# Patient Record
Sex: Female | Born: 1935 | Race: Black or African American | Hispanic: No | Marital: Married | Smoking: Former smoker
Health system: Southern US, Community
[De-identification: ages and names within clinical notes are randomized; demographics above are authoritative.]

## PROBLEM LIST (undated history)

## (undated) DIAGNOSIS — C169 Malignant neoplasm of stomach, unspecified: Secondary | ICD-10-CM

## (undated) DIAGNOSIS — H269 Unspecified cataract: Secondary | ICD-10-CM

## (undated) DIAGNOSIS — E079 Disorder of thyroid, unspecified: Secondary | ICD-10-CM

## (undated) DIAGNOSIS — N189 Chronic kidney disease, unspecified: Secondary | ICD-10-CM

## (undated) DIAGNOSIS — D631 Anemia in chronic kidney disease: Secondary | ICD-10-CM

## (undated) HISTORY — DX: Disorder of thyroid, unspecified: E07.9

## (undated) HISTORY — DX: Malignant neoplasm of stomach, unspecified: C16.9

## (undated) HISTORY — DX: Chronic kidney disease, unspecified: N18.9

## (undated) HISTORY — DX: Unspecified cataract: H26.9

## (undated) HISTORY — DX: Anemia in chronic kidney disease: D63.1

---

## 2012-04-07 HISTORY — PX: CATARACT EXTRACTION: SUR2

## 2015-10-02 ENCOUNTER — Telehealth: Payer: Self-pay | Admitting: *Deleted

## 2015-10-02 ENCOUNTER — Encounter: Payer: Self-pay | Admitting: Internal Medicine

## 2015-10-02 ENCOUNTER — Inpatient Hospital Stay: Payer: Medicare HMO

## 2015-10-02 ENCOUNTER — Inpatient Hospital Stay: Payer: Medicare HMO | Attending: Internal Medicine | Admitting: Internal Medicine

## 2015-10-02 VITALS — BP 105/62 | HR 94 | Temp 98.0°F | Resp 18 | Wt 144.6 lb

## 2015-10-02 DIAGNOSIS — C787 Secondary malignant neoplasm of liver and intrahepatic bile duct: Secondary | ICD-10-CM | POA: Diagnosis not present

## 2015-10-02 DIAGNOSIS — C49A2 Gastrointestinal stromal tumor of stomach: Secondary | ICD-10-CM | POA: Diagnosis not present

## 2015-10-02 DIAGNOSIS — Z87891 Personal history of nicotine dependence: Secondary | ICD-10-CM | POA: Diagnosis not present

## 2015-10-02 DIAGNOSIS — Z79899 Other long term (current) drug therapy: Secondary | ICD-10-CM | POA: Insufficient documentation

## 2015-10-02 DIAGNOSIS — D649 Anemia, unspecified: Secondary | ICD-10-CM | POA: Diagnosis not present

## 2015-10-02 DIAGNOSIS — C772 Secondary and unspecified malignant neoplasm of intra-abdominal lymph nodes: Secondary | ICD-10-CM | POA: Diagnosis not present

## 2015-10-02 DIAGNOSIS — D6489 Other specified anemias: Secondary | ICD-10-CM

## 2015-10-02 DIAGNOSIS — R221 Localized swelling, mass and lump, neck: Secondary | ICD-10-CM | POA: Insufficient documentation

## 2015-10-02 LAB — CBC WITH DIFFERENTIAL/PLATELET
Basophils Absolute: 0.1 10*3/uL (ref 0–0.1)
EOS ABS: 0.3 10*3/uL (ref 0–0.7)
Eosinophils Relative: 3 %
HCT: 19.9 % — ABNORMAL LOW (ref 35.0–47.0)
HEMOGLOBIN: 6.4 g/dL — AB (ref 12.0–16.0)
LYMPHS ABS: 0.9 10*3/uL — AB (ref 1.0–3.6)
Lymphocytes Relative: 9 %
MCH: 33.7 pg (ref 26.0–34.0)
MCHC: 32.1 g/dL (ref 32.0–36.0)
MCV: 104.8 fL — ABNORMAL HIGH (ref 80.0–100.0)
MONO ABS: 1.1 10*3/uL — AB (ref 0.2–0.9)
Neutro Abs: 7.4 10*3/uL — ABNORMAL HIGH (ref 1.4–6.5)
Platelets: 497 10*3/uL — ABNORMAL HIGH (ref 150–440)
RBC: 1.9 MIL/uL — ABNORMAL LOW (ref 3.80–5.20)
RDW: 20.6 % — AB (ref 11.5–14.5)
WBC: 9.7 10*3/uL (ref 3.6–11.0)

## 2015-10-02 LAB — COMPREHENSIVE METABOLIC PANEL
ALBUMIN: 3.2 g/dL — AB (ref 3.5–5.0)
ALT: 12 U/L — AB (ref 14–54)
AST: 39 U/L (ref 15–41)
Alkaline Phosphatase: 76 U/L (ref 38–126)
Anion gap: 9 (ref 5–15)
BUN: 20 mg/dL (ref 6–20)
CHLORIDE: 102 mmol/L (ref 101–111)
CO2: 23 mmol/L (ref 22–32)
CREATININE: 1.97 mg/dL — AB (ref 0.44–1.00)
Calcium: 9 mg/dL (ref 8.9–10.3)
GFR calc Af Amer: 26 mL/min — ABNORMAL LOW (ref >60)
GFR, EST NON AFRICAN AMERICAN: 22 mL/min — AB (ref >60)
GLUCOSE: 110 mg/dL — AB (ref 65–99)
Potassium: 4.3 mmol/L (ref 3.5–5.1)
SODIUM: 134 mmol/L — AB (ref 135–145)
Total Bilirubin: 0.5 mg/dL (ref 0.3–1.2)
Total Protein: 6.4 g/dL — ABNORMAL LOW (ref 6.5–8.1)

## 2015-10-02 LAB — FERRITIN: FERRITIN: 515 ng/mL — AB (ref 11–307)

## 2015-10-02 LAB — LACTATE DEHYDROGENASE: LDH: 618 U/L — AB (ref 98–192)

## 2015-10-02 LAB — IRON AND TIBC
Iron: 47 ug/dL (ref 28–170)
SATURATION RATIOS: 19 % (ref 10.4–31.8)
TIBC: 247 ug/dL — ABNORMAL LOW (ref 250–450)
UIBC: 200 ug/dL

## 2015-10-02 MED ORDER — IMATINIB MESYLATE 100 MG PO TABS: 400.0000 mg | ORAL_TABLET | Freq: Every day | ORAL | Status: AC

## 2015-10-02 MED ORDER — ONDANSETRON HCL 8 MG PO TABS: 8.0000 mg | ORAL_TABLET | Freq: Three times a day (TID) | ORAL | Status: AC | PRN

## 2015-10-02 NOTE — Progress Notes (Signed)
[facility] CONSULT NOTE  No care team member to display  CHIEF COMPLAINTS/PURPOSE OF CONSULTATION:  Oncology History   # SEP 2013-s/p Bx- GIST Upper GIB /[facility] [annular mass in stomach]; eval at [facility] [Dr.Hathorn]   # OCT 2013- Gleevec 400mg /d; 200mg / [sec to intol] March-Oct 2014- CT- PR;   # MAY 11th 2017- CT [non-contras tC/A/P compared to jan 2017]- PROGRESSION multiple innumerable lesions in liver [35mm- 3.3cm]; LUQ mass [10x 9.3--7.5 x 6.0]; new portahepatis LN; peri-tumoral LN; LN in mesenteric fat ~2.5cm   # May 11th 2017- Thyroid enlargement [R>L]  # Anemia/ CKD      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  female patient with above history of metastatic gist tumor from [place] is currently here to establish care. I reviewed the records from her previous oncologist office Dr. Humphrey Rolls in [place] above.  Patient complains of progressive shortness of breath in the last few days. No cough. Poor appetite. No significant weight loss. No nausea no vomiting. She is on oral iron. Complains of dark stool. No blood in stools. She has rare left upper quadrant pain. She has no back pain. No other bone pain.   ROS: A complete 10 point review of system is done which is negative except mentioned above in history of present illness  MEDICAL HISTORY:  Past Medical History  Diagnosis Date  . Stomach cancer ([place])   . Cataract   . Thyroid disease     SURGICAL HISTORY: Past Surgical History  Procedure Laterality Date  . Cataract extraction  2014    SOCIAL HISTORY:No current smoking. No alcohol. Patient lives with her daughter in [place]. Social History   Social History  . Marital Status: Unknown    Spouse Name: N/A  . Number of Children: N/A  . Years of Education: N/A    Occupational History  . Not on file.   Social History Main Topics  . Smoking status: Former Smoker -- 1.00 packs/day for 40 years    Types: Cigarettes  . Smokeless tobacco: Former Systems developer    Quit date: 09/05/2005  . Alcohol Use: Not on file  . Drug Use: Not on file  . Sexual Activity: Not on file   Other Topics Concern  . Not on file   Social History Narrative  . No narrative on file    FAMILY HISTORY: No family history on file.  ALLERGIES:  is allergic to penicillins.  MEDICATIONS:  Current Outpatient Prescriptions  Medication Sig Dispense Refill  . ACCU-CHEK FASTCLIX LANCETS MISC     . acetaminophen (TYLENOL) 325 MG tablet Take 650 mg by mouth.    . citalopram (CELEXA) 20 MG tablet Take 20 mg by mouth daily.     . ferrous sulfate 325 (65 FE) MG EC tablet Take 325 mg by mouth 3 (three) times daily with meals.    . imatinib (GLEEVEC) 100 MG tablet Take 4 tablets (400 mg total) by mouth daily. 120 tablet 4  . lisinopril (PRINIVIL,ZESTRIL) 2.5 MG tablet Take 2.5 mg by mouth daily.     Marland Kitchen losartan (COZAAR) 25 MG tablet Take 25 mg by mouth daily.     . ondansetron (ZOFRAN) 8 MG tablet Take 1 tablet (8 mg total) by mouth every 8 (eight) hours as needed for nausea. 60 tablet 3  . pioglitazone (ACTOS) 30  MG tablet Take 30 mg by mouth.    . ranitidine (ZANTAC) 150 MG tablet Take 150 mg by mouth.     No current facility-administered medications for this visit.      Marland Kitchen  PHYSICAL EXAMINATION: ECOG PERFORMANCE STATUS: 1 - Symptomatic but completely ambulatory  Filed Vitals:   10/02/15 1421  BP: 105/62  Pulse: 94  Temp: 98 F (36.7 C)  Resp: 18   Filed Weights   10/02/15 1421  Weight: 144 lb 10 oz (65.6 kg)    GENERAL: Well-nourished well-developed; Alert, Mild shortness of breath on exertion. Accompanied by 3 daughters. EYES: positive for pallor.  OROPHARYNX: no thrush or ulceration.  NECK: supple, bilateral thyroid masses felt. LYMPH:  no palpable lymphadenopathy  in the cervical, axillary or inguinal regions LUNGS: clear to auscultation and  No wheeze or crackles HEART/CVS: regular rate & rhythm and no murmurs; No lower extremity edema ABDOMEN: abdomen soft, non-tender and normal bowel sounds Musculoskeletal:no cyanosis of digits and no clubbing  PSYCH: alert & oriented x 3 with fluent speech NEURO: no focal motor/sensory deficits SKIN:  no rashes or significant lesions  LABORATORY DATA:  I have reviewed the data as listed Lab Results  Component Value Date   WBC 9.7 10/02/2015   HGB 6.4* 10/02/2015   HCT 19.9* 10/02/2015   MCV 104.8* 10/02/2015   PLT 497* 10/02/2015    Recent Labs  10/02/15 1536  NA 134*  K 4.3  CL 102  CO2 23  GLUCOSE 110*  BUN 20  CREATININE 1.97*  CALCIUM 9.0  GFRNONAA 22*  GFRAA 26*  PROT 6.4*  ALBUMIN 3.2*  AST 39  ALT 12*  ALKPHOS 76  BILITOT 0.5    RADIOGRAPHIC STUDIES: I have personally reviewed the radiological images as listed and agreed with the findings in the report. No results found.  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 I recommend going up on the [place] to 400 mg once a day. Discussed regarding use of antiemetics preemptively.  # I discussed the use of sunitinib; and also use of Regorafenib in the treatment of progressive gist tumor. Patient has been on suboptimal doses of Gleevec so foar; certainly could be reasonable to go up on the dose and await the response.  # Given the history of anemia- check CBC CMP LDH iron studies. Patient mentions of a subcutaneous injection question Procrit. We will await labs.  # Neck swelling/thyroid enlargement- does not appear to cause any upper respiratory tract obstruction however as per family the neck enlargement has been recent- we will get a CT of the neck without contrast.   Based  on the CT scan we will plan to get a- ultrasound of the neck.   All questions were answered. The patient knows to call the clinic with any problems, questions or concerns.  # 60 minutes face-to-face with the patient discussing the above plan of care; more than 50% of time spent on prognosis/ natural history; counseling and coordination.    Cammie Sickle, MD 10/02/2015 5:03 PM  Addendum:   Patient labs show hemoglobin of 6.4 MCV 104; hemoglobin 6 and platelets 400. Creatinine 1.97. Patient had already left the building/driving to [place]; recommend go to emergency room for blood transfusion.

## 2015-10-02 NOTE — Assessment & Plan Note (Signed)
Metastatic gist to the liver and abdominal lymph nodes- currently patient is on 300 mg of Gleevec for the last 1 month. I recommend going up on the [place] to 400 mg once a day. Discussed regarding use of antiemetics preemptively.  # I discussed the use of sunitinib; and also use of Regorafenib in the treatment of progressive gist tumor. Patient has been on suboptimal doses of Gleevec so foar; certainly could be reasonable to go up on the dose and await the response.  # Given the history of anemia- check CBC CMP LDH iron studies. Patient mentions of a subcutaneous injection question Procrit. We will await labs.  # Neck swelling/thyroid enlargement- does not appear to cause any upper respiratory tract obstruction however as per family the neck enlargement has been recent- we will get a CT of the neck without contrast.

## 2015-10-02 NOTE — Telephone Encounter (Signed)
Critical Hgb - 6.4 Hct 19.9  MD notified.  VO per MD - call patient's daughter and have her contact PCP and see if blood transfusion can be done in [place]?  If not she should call us back and we will schedule patient for transfusion.  Called patient's daughter and she states she will contact PCP and if they cannot cannot help her she will take patient to ED.

## 2015-10-02 NOTE — Progress Notes (Signed)
Patient here today as new evaluation from [facility] closed down.  Referred by Dr. Marcy Panning  Patient has stomach cancer with mets to liver and lymph system.  Currently on Gleevac.  Patient states she has an enlarged area in her neck that she was to have biopsied, however, that has not been scheduled.  Patient extremely SOB on exertion.  O2 99%, however, had to sit after walking from waiting room. Patient requested information regarding advance directive. Information given to patient.

## 2015-10-02 NOTE — Progress Notes (Signed)
New patient transfer of care. Patient currently on Gleevac. She uses [facility].  She experiences intermittent nausea with her gleevac which is control with her antiemetic-zofran.  She has not missed any doses. Although she reports a nodule in her neck, she does not have any concerns with dysphagia. She is able to swallow her medications/tablets easily.  A consent form was signed today, while patient was in the clinic to continue her gleevac rx under the care of Dr. Rogue Bussing. Patient states that she received financial assistance with Gleevac. She states that she does not have any copay cost. She does not know which company assists her with the copay. I explained that I will contact [employer] for this information. She believes that her copay assistance should be available until 2018.

## 2015-10-10 ENCOUNTER — Telehealth: Payer: Self-pay | Admitting: *Deleted

## 2015-10-10 ENCOUNTER — Ambulatory Visit
Admission: RE | Admit: 2015-10-10 | Discharge: 2015-10-10 | Disposition: A | Discharge status: Home / Self Care | Payer: Medicare HMO | Source: Ambulatory Visit | Attending: Internal Medicine | Admitting: Internal Medicine

## 2015-10-10 DIAGNOSIS — N289 Disorder of kidney and ureter, unspecified: Secondary | ICD-10-CM | POA: Diagnosis not present

## 2015-10-10 DIAGNOSIS — C49A2 Gastrointestinal stromal tumor of stomach: Secondary | ICD-10-CM | POA: Insufficient documentation

## 2015-10-10 DIAGNOSIS — R221 Localized swelling, mass and lump, neck: Secondary | ICD-10-CM | POA: Diagnosis present

## 2015-10-10 DIAGNOSIS — R590 Localized enlarged lymph nodes: Secondary | ICD-10-CM | POA: Diagnosis not present

## 2015-10-10 DIAGNOSIS — M47812 Spondylosis without myelopathy or radiculopathy, cervical region: Secondary | ICD-10-CM | POA: Diagnosis not present

## 2015-10-10 DIAGNOSIS — D6489 Other specified anemias: Secondary | ICD-10-CM | POA: Diagnosis present

## 2015-10-10 DIAGNOSIS — J439 Emphysema, unspecified: Secondary | ICD-10-CM | POA: Insufficient documentation

## 2015-10-10 NOTE — Telephone Encounter (Signed)
Pt having persistent nausea with 4 tablets of gleevac 100 mg. Daughter reports that the patient is taking all 4 tabs at once. RN Spoke with md. Dr. Rogue Bussing advised pt to start dividing the dose. She needs to take gleevac 100 mg 2 tabs in am and 2 tabs in pm.   Teach back process performed.

## 2015-10-12 ENCOUNTER — Telehealth: Payer: Self-pay | Admitting: *Deleted

## 2015-10-12 ENCOUNTER — Other Ambulatory Visit: Payer: Self-pay | Admitting: Internal Medicine

## 2015-10-12 DIAGNOSIS — N189 Chronic kidney disease, unspecified: Secondary | ICD-10-CM

## 2015-10-12 DIAGNOSIS — D638 Anemia in other chronic diseases classified elsewhere: Secondary | ICD-10-CM

## 2015-10-12 DIAGNOSIS — D631 Anemia in chronic kidney disease: Secondary | ICD-10-CM | POA: Insufficient documentation

## 2015-10-12 NOTE — Telephone Encounter (Signed)
Daughter called this morning. Patient continues to have persistent nausea and vomiting. She is taking zofran every 8 hrs as directed.  She is taking Gleevac 100 mg 2 tablets in am and 2 tabs in pm.  Since increasing dosing change to 400 mgs daily, daughter feels that the dose may be 'too strong.' daughter requesting dose reduction to 300 mg. (daughter was instructed to have patient take Gleevac 100 mg three times daily. Daughter instructed to have patient take the gleevac in am, lunch time and bedtime.  She was instructed to contact Dr. Rogue Bussing back if nausea persists.

## 2015-10-15 ENCOUNTER — Encounter: Payer: Self-pay | Admitting: *Deleted

## 2015-10-15 ENCOUNTER — Telehealth: Payer: Self-pay | Admitting: *Deleted

## 2015-10-15 NOTE — Telephone Encounter (Signed)
-----   Message from Cammie Sickle, MD sent at 10/12/2015  7:43 PM EDT ----- i added arnesp to pt plan; need to start on 7/11- Thx

## 2015-10-15 NOTE — Telephone Encounter (Signed)
msg sent to cancer center scheduling to sch. Pt for aranesp on 7/11

## 2015-10-16 ENCOUNTER — Inpatient Hospital Stay: Payer: Medicare HMO | Attending: Internal Medicine | Admitting: Internal Medicine

## 2015-10-16 ENCOUNTER — Inpatient Hospital Stay: Payer: Medicare HMO

## 2015-10-16 ENCOUNTER — Ambulatory Visit
Admission: RE | Admit: 2015-10-16 | Discharge: 2015-10-16 | Disposition: A | Discharge status: Home / Self Care | Payer: Medicare HMO | Source: Ambulatory Visit | Attending: Internal Medicine | Admitting: Internal Medicine

## 2015-10-16 ENCOUNTER — Other Ambulatory Visit: Payer: Self-pay | Admitting: *Deleted

## 2015-10-16 VITALS — BP 120/73 | HR 102 | Temp 97.4°F | Resp 18 | Wt 141.3 lb

## 2015-10-16 DIAGNOSIS — Z79899 Other long term (current) drug therapy: Secondary | ICD-10-CM | POA: Diagnosis not present

## 2015-10-16 DIAGNOSIS — C787 Secondary malignant neoplasm of liver and intrahepatic bile duct: Secondary | ICD-10-CM | POA: Diagnosis not present

## 2015-10-16 DIAGNOSIS — D630 Anemia in neoplastic disease: Secondary | ICD-10-CM | POA: Insufficient documentation

## 2015-10-16 DIAGNOSIS — Z87891 Personal history of nicotine dependence: Secondary | ICD-10-CM | POA: Insufficient documentation

## 2015-10-16 DIAGNOSIS — C772 Secondary and unspecified malignant neoplasm of intra-abdominal lymph nodes: Secondary | ICD-10-CM | POA: Insufficient documentation

## 2015-10-16 DIAGNOSIS — C49A2 Gastrointestinal stromal tumor of stomach: Secondary | ICD-10-CM | POA: Diagnosis not present

## 2015-10-16 DIAGNOSIS — R197 Diarrhea, unspecified: Secondary | ICD-10-CM

## 2015-10-16 DIAGNOSIS — R1310 Dysphagia, unspecified: Secondary | ICD-10-CM | POA: Insufficient documentation

## 2015-10-16 DIAGNOSIS — J3801 Paralysis of vocal cords and larynx, unilateral: Secondary | ICD-10-CM | POA: Diagnosis not present

## 2015-10-16 DIAGNOSIS — D631 Anemia in chronic kidney disease: Secondary | ICD-10-CM

## 2015-10-16 DIAGNOSIS — N189 Chronic kidney disease, unspecified: Secondary | ICD-10-CM | POA: Diagnosis not present

## 2015-10-16 DIAGNOSIS — E079 Disorder of thyroid, unspecified: Secondary | ICD-10-CM | POA: Diagnosis not present

## 2015-10-16 DIAGNOSIS — R221 Localized swelling, mass and lump, neck: Secondary | ICD-10-CM | POA: Diagnosis not present

## 2015-10-16 LAB — SAMPLE TO BLOOD BANK

## 2015-10-16 LAB — CBC WITH DIFFERENTIAL/PLATELET
BASOS PCT: 0 %
Basophils Absolute: 0 10*3/uL (ref 0–0.1)
EOS ABS: 0.3 10*3/uL (ref 0–0.7)
Eosinophils Relative: 2 %
HCT: 24.5 % — ABNORMAL LOW (ref 35.0–47.0)
Hemoglobin: 8 g/dL — ABNORMAL LOW (ref 12.0–16.0)
LYMPHS ABS: 1.1 10*3/uL (ref 1.0–3.6)
Lymphocytes Relative: 7 %
MCH: 32.5 pg (ref 26.0–34.0)
MCHC: 32.6 g/dL (ref 32.0–36.0)
MCV: 99.8 fL (ref 80.0–100.0)
MONO ABS: 1.5 10*3/uL — AB (ref 0.2–0.9)
Monocytes Relative: 10 %
NRBC: 0 /100{WBCs}
Neutro Abs: 12.5 10*3/uL — ABNORMAL HIGH (ref 1.4–6.5)
Neutrophils Relative %: 81 %
PLATELETS: 399 10*3/uL (ref 150–440)
RBC: 2.46 MIL/uL — ABNORMAL LOW (ref 3.80–5.20)
RDW: 20.4 % — AB (ref 11.5–14.5)
WBC: 15.4 10*3/uL — ABNORMAL HIGH (ref 3.6–11.0)

## 2015-10-16 LAB — COMPREHENSIVE METABOLIC PANEL
ALK PHOS: 97 U/L (ref 38–126)
ALT: 15 U/L (ref 14–54)
ANION GAP: 13 (ref 5–15)
AST: 45 U/L — ABNORMAL HIGH (ref 15–41)
Albumin: 3 g/dL — ABNORMAL LOW (ref 3.5–5.0)
BILIRUBIN TOTAL: 1.3 mg/dL — AB (ref 0.3–1.2)
BUN: 25 mg/dL — ABNORMAL HIGH (ref 6–20)
CALCIUM: 9 mg/dL (ref 8.9–10.3)
CO2: 20 mmol/L — AB (ref 22–32)
Chloride: 101 mmol/L (ref 101–111)
Creatinine, Ser: 1.76 mg/dL — ABNORMAL HIGH (ref 0.44–1.00)
GFR calc non Af Amer: 26 mL/min — ABNORMAL LOW (ref >60)
GFR, EST AFRICAN AMERICAN: 30 mL/min — AB (ref >60)
Glucose, Bld: 99 mg/dL (ref 65–99)
POTASSIUM: 4.2 mmol/L (ref 3.5–5.1)
SODIUM: 134 mmol/L — AB (ref 135–145)
TOTAL PROTEIN: 6.3 g/dL — AB (ref 6.5–8.1)

## 2015-10-16 MED ORDER — DARBEPOETIN ALFA 200 MCG/0.4ML IJ SOSY
200.0000 ug | PREFILLED_SYRINGE | Freq: Once | INTRAMUSCULAR | Status: AC
  Administered 2015-10-16: 200 ug via SUBCUTANEOUS
  Filled 2015-10-16: qty 0.4

## 2015-10-16 NOTE — Assessment & Plan Note (Addendum)
#   Metastatic gist to the liver and abdominal lymph nodes/ progressive on - currently patient is on 300 mg of Gleevec for the last 1 month. Recommend taking with food; recommend going up to 400 milligrams a day if her tolerance improves.   # Discussed regarding- use of sunitinib; however I'm concerned that poor tolerance to sunitinib. However continued Gleevec For now.   # Symptomatic Anemia- multifactorial/CKD/malignancy/ Gleevec- status post PRBC transfusion 2 weeks ago; today hemoglobin 8.2. start patient on Aranesp every 2 weeks. Iron stores are adequate- ferritin 500/ 2 weeks ago.  # Neck swelling/thyroid enlargement- reviewed the CT scan and no evidence of any respiratory compromise. Also recommend ultrasound and biopsy of the mass. Recommend ENT evaluation.   # Dysphagia- recommend esophagogram.   # Over poor prognosis; I discussed my above plan with the patient's primary care doctor dr.Godwin at [phone].   #  patient follow-up with me in approximately 2 weeks or sooner/ based on above workup.    # 25 minutes face-to-face with the patient discussing the above plan of care; more than 50% of time spent on prognosis/ natural history; counseling and coordination.

## 2015-10-16 NOTE — Progress Notes (Signed)
[facility] NOTE  Patient Care Team: Cammie Sickle, MD as PCP - General (Internal Medicine)  CHIEF COMPLAINTS/PURPOSE OF CONSULTATION:  Oncology History   # SEP 2013-s/p Bx- GIST Upper GIB /[facility] E[facility] [annular mass in stomach]; eval at [facility] [Dr.Hathorn]   # OCT 2013- Gleevec 400mg /d; 200mg / [sec to intol] March-Oct 2014- CT- PR;   # MAY 11th 2017- CT [non-contras tC/A/P compared to jan 2017]- PROGRESSION multiple innumerable lesions in liver [8mm- 3.3cm]; LUQ mass [10x 9.3--7.5 x 6.0]; new portahepatis LN; peri-tumoral LN; LN in mesenteric fat ~2.5cm   # May 11th 2017- Thyroid enlargement [R>L]  # Anemia/ CKD      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  female patient with above history of metastatic gist tumor from [place]- Currently on [place] is here for follow-up.  Patient received blood transfusion the emergency room and [place] 2 weeks ago when the hemoglobin was down to 6.5.   At last visit patient was recommended going up on [place] to 400 mg; patient had noticed increased nausea vomiting diarrhea. She had been taking 100 milligram pills 1 pill 3 times a day. She had been taking an empty stomach. She also has having intermittent nausea; vomiting. Intermittent diarrhea.  Patient complains of progressive shortness of breath in the last few days. No cough. Poor appetite. Patient also complains of difficulty swallowing choking while taking her pills.   ROS: A complete 10 point review of system is done which is negative except mentioned above in history of present illness  MEDICAL HISTORY:  Past Medical History  Diagnosis Date  . Stomach cancer ([place])   . Cataract   . Thyroid disease   . Anemia in CKD (chronic kidney disease)   . Anemia of chronic renal failure      SURGICAL HISTORY: Past Surgical History  Procedure Laterality Date  . Cataract extraction  2014    SOCIAL HISTORY:No current smoking. No alcohol. Patient lives with her daughter in [place]. Social History   Social History  . Marital Status: Widowed    Spouse Name: N/A  . Number of Children: N/A  . Years of Education: N/A   Occupational History  . Not on file.   Social History Main Topics  . Smoking status: Former Smoker -- 1.00 packs/day for 40 years    Types: Cigarettes  . Smokeless tobacco: Former Systems developer    Quit date: 09/05/2005  . Alcohol Use: Not on file  . Drug Use: Not on file  . Sexual Activity: Not on file   Other Topics Concern  . Not on file   Social History Narrative    FAMILY HISTORY: No family history on file.  ALLERGIES:  is allergic to penicillins.  MEDICATIONS:  Current Outpatient Prescriptions  Medication Sig Dispense Refill  . ACCU-CHEK FASTCLIX LANCETS MISC     . acetaminophen (TYLENOL) 325 MG tablet Take 650 mg by mouth.    . citalopram (CELEXA) 20 MG tablet Take 20 mg by mouth daily.     . ferrous sulfate 325 (65 FE) MG EC tablet Take 325 mg by mouth 3 (three) times daily with meals.    . imatinib (GLEEVEC) 100 MG tablet Take 4 tablets (400 mg total) by mouth daily. 120 tablet 4  . lisinopril (PRINIVIL,ZESTRIL) 2.5 MG tablet Take 2.5 mg by mouth daily.     Marland Kitchen  losartan (COZAAR) 25 MG tablet Take 25 mg by mouth daily.     . ondansetron (ZOFRAN) 8 MG tablet Take 1 tablet (8 mg total) by mouth every 8 (eight) hours as needed for nausea. 60 tablet 3  . pioglitazone (ACTOS) 30 MG tablet Take 30 mg by mouth.    . ranitidine (ZANTAC) 150 MG tablet Take 150 mg by mouth.     No current facility-administered medications for this visit.      Marland Kitchen  PHYSICAL EXAMINATION: ECOG PERFORMANCE STATUS: 2 - Symptomatic, <50% confined to bed  Filed Vitals:   10/16/15 1405  BP: 120/73  Pulse: 102  Temp: 97.4 F (36.3 C)  Resp: 18   Filed Weights    10/16/15 1405  Weight: 141 lb 5 oz (64.1 kg)    GENERAL: Well-nourished well-developed; Alert, Mild shortness of breath on exertion. Accompanied by 3 daughters. She is in a wheel chair.  EYES: positive for pallor.  OROPHARYNX: no thrush or ulceration.  NECK: supple, bilateral thyroid masses felt. LYMPH:  no palpable lymphadenopathy in the cervical, axillary or inguinal regions LUNGS: clear to auscultation and  No wheeze or crackles HEART/CVS: regular rate & rhythm and no murmurs; No lower extremity edema ABDOMEN: abdomen soft, non-tender and normal bowel sounds Musculoskeletal:no cyanosis of digits and no clubbing  PSYCH: alert & oriented x 3 with fluent speech NEURO: no focal motor/sensory deficits SKIN:  no rashes or significant lesions  LABORATORY DATA:  I have reviewed the data as listed Lab Results  Component Value Date   WBC 15.4* 10/16/2015   HGB 8.0* 10/16/2015   HCT 24.5* 10/16/2015   MCV 99.8 10/16/2015   PLT 399 10/16/2015    Recent Labs  10/02/15 1536 10/16/15 1455  NA 134* 134*  K 4.3 4.2  CL 102 101  CO2 23 20*  GLUCOSE 110* 99  BUN 20 25*  CREATININE 1.97* 1.76*  CALCIUM 9.0 9.0  GFRNONAA 22* 26*  GFRAA 26* 30*  PROT 6.4* 6.3*  ALBUMIN 3.2* 3.0*  AST 39 45*  ALT 12* 15  ALKPHOS 76 97  BILITOT 0.5 1.3*    RADIOGRAPHIC STUDIES: I have personally reviewed the radiological images as listed and agreed with the findings in the report. Ct Soft Tissue Neck Wo Contrast  10/10/2015  CLINICAL DATA:  Gastrointestinal stromal tumor stomach. Anemia. Neck mass EXAM: CT NECK WITHOUT CONTRAST TECHNIQUE: Multidetector CT imaging of the neck was performed following the standard protocol without intravenous contrast. COMPARISON:  None. FINDINGS: Intravenous contrast not given due to renal insufficiency. This limits evaluation of the thyroid mass and lymph nodes. Pharynx and larynx: Tongue and pharynx normal. No mass in the tonsillar tongue. Normal vocal cords Salivary  glands: Parotid and submandibular glands normal bilaterally. Thyroid: Large mass in the right thyroid. This appears lobular and may be several adjacent nodules. Overall the mass measures approximately 3.6 x 4.3 cm. Limited evaluation without intravenous contrast. No mass in the left lobe of the thyroid Lymph nodes: Right submandibular node 9 mm with ill-defined margins. Possible pathologic node. No other enlarged or suspicious nodes in the neck. Vascular: Vascular patency not evaluated without intravenous contrast. Mild atherosclerotic calcification in the left carotid artery. Limited intracranial: Negative Visualized orbits: Not imaged Mastoids and visualized paranasal sinuses: Negative Skeleton: Multilevel disc degeneration and spondylosis which is moderate to advanced. Multilevel facet degeneration on the right. No fracture or mass lesion. Upper chest: Apical emphysema and blebs. Small left effusion. No mass or lung nodule in the  apex apices. IMPRESSION: Suboptimal evaluation the thyroid and lymph nodes due to lack of intravenous contrast. The patient has renal insufficiency Lobular mass in the right lobe of the thyroid measuring approximately 3.6 x 4.3 cm. This is suspicious for neoplasm and biopsy is recommended. Right submandibular lymph node 9 mm is concerning for metastatic disease. Apical emphysema Cervical spondylosis Electronically Signed   By: Franchot Gallo M.D.   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 Recommend taking with food; recommend going up to 400 milligrams a day if her tolerance improves.   # Discussed regarding- use of sunitinib; however I'm concerned that poor tolerance to sunitinib. However continued Gleevec For now.   # Symptomatic Anemia- multifactorial/CKD/malignancy/  Gleevec- status post PRBC transfusion 2 weeks ago; today hemoglobin 8.2. start patient on Aranesp every 2 weeks. Iron stores are adequate- ferritin 500/ 2 weeks ago.  # Neck swelling/thyroid enlargement- reviewed the CT scan and no evidence of any respiratory compromise. Also recommend ultrasound and biopsy of the mass.  # Dysphagia- recommend esophagogram.   # Over poor prognosis; I discussed my above plan with the patient's primary care doctor dr.Godwin at [phone].   #  patient follow-up with me in approximately 2 weeks or sooner/ based on above workup.    # 25 minutes face-to-face with the patient discussing the above plan of care; more than 50% of time spent on prognosis/ natural history; counseling and coordination.   All questions were answered. The patient knows to call the clinic with any problems, questions or concerns.     Cammie Sickle, MD 10/16/2015 4:00 PM

## 2015-10-16 NOTE — Patient Instructions (Signed)
Imatinib tablets What is this medicine? IMATINIB (i MAT in ib) is a medicine that targets proteins in cancer cells and stops the cancer cells from growing. It is used to treat certain leukemias, myelodysplastic syndromes, and other cancers. It is also used to treat specific digestive tract tumors called GISTs. This medicine may be used for other purposes; ask your health care provider or pharmacist if you have questions. What should I tell my health care provider before I take this medicine? They need to know if you have any of these conditions: -bleeding problems -infection (especially a virus infection such as chickenpox, cold sores, or herpes) -heart disease -heart failure -kidney disease -liver disease -lung disease -stomach problems -an unusual or allergic reaction to imatinib, other medicines, foods, dyes, or preservatives -pregnant or trying to get pregnant -breast-feeding How should I use this medicine? Take this medicine by mouth with a full glass of water. Take it with food to decrease the chance of it upsetting your stomach. Do not take with grapefruit juice. Follow the directions on the prescription label. Take your medicine at regular intervals. Do not take it more often than directed. Do not stop taking except on your doctor's advice. If you have difficulty swallowing the tablets, let your doctor, pharmacist, or health care professional know. They can help you with advice. Talk to your pediatrician regarding the use of this medicine in children. While this drug may be prescribed for children as young as 1 year for selected conditions, precautions do apply. Overdosage: If you think you have taken too much of this medicine contact a poison control center or emergency room at once. NOTE: This medicine is only for you. Do not share this medicine with others. What if I miss a dose? If you miss a dose, take it as soon as you can. If it is almost time for your next dose, take only that  dose and skip your missed dose. Do not take double or extra doses. What may interact with this medicine? -antiviral medicines for HIV or AIDS -bosentan -cisapride -clarithromycin -cyclosporine -dexamethasone -diltiazem -ergot alkaloids like dihydroergotamine, ergonovine, ergotamine, methylergonovine -erythromycin -grapefruit or grapefruit juice -medicines for cholesterol like atorvastatin lovastatin, simvastatin -medicines for depression, anxiety, or psychotic disturbances -medicines for fungal infections like ketoconazole and itraconazole -medicines for irregular heart beat like amiodarone, bepridil, dofetilide, encainide, flecainide, propafenone, quinidine -medicines for seizures like carbamazepine, phenobarbital, phenytoin -medicines for sleep -NSAIDS, medicines for pain and inflammation, like ibuprofen or naproxen -pimozide -rifabutin -rifampin -sildenafil -sirolimus -St. John's wort -tacrolimus -vaccines -verapamil -warfarin Talk to your doctor or health care professional before taking any of these medicines: -acetaminophen -aspirin -ibuprofen -ketoprofen -naproxen This list may not describe all possible interactions. Give your health care provider a list of all the medicines, herbs, non-prescription drugs, or dietary supplements you use. Also tell them if you smoke, drink alcohol, or use illegal drugs. Some items may interact with your medicine. What should I watch for while using this medicine? Visit your doctor for checks on your progress. You will need to have regular blood tests while on this medicine. Report any new symptoms promptly. Call your doctor or health care professional for advice if you get a fever, chills or sore throat, or other symptoms of a cold or flu. Do not treat yourself. This drug decreases your body's ability to fight infections. Try to avoid being around people who are sick. This medicine may increase your risk to bruise or bleed. Call your  doctor or health care  professional if you notice any unusual bleeding. You may get drowsy or dizzy. Do not drive, use machinery, or do anything that needs mental alertness until you know how this medicine affects you. Do not become pregnant while taking this medicine or for 14 days after stopping it. Women should inform their doctor if they wish to become pregnant or think they might be pregnant. There is a potential for serious side effects to an unborn child. Talk to your health care professional or pharmacist for more information. Do not breast-feed an infant while taking this medicine or for 1 month after stopping it. What side effects may I notice from receiving this medicine? Side effects that you should report to your doctor or health care professional as soon as possible: -low blood counts - this medicine may decrease the number of white blood cells, red blood cells and platelets. You may be at increased risk for infections and bleeding. -signs of infection - fever or chills, cough, sore throat, pain or difficulty passing urine -signs of decreased platelets or bleeding - bruising, pinpoint red spots on the skin, black, tarry stools, blood in the urine, nosebleeds -signs of decreased red blood cells - unusually weak or tired, fainting spells, lightheadedness -allergic reactions like skin rash, itching or hives, swelling of the face, lips, or tongue -breathing problems -changes in vision -dark urine -general ill feeling or flu-like symptoms -light-colored stools -loss of appetite -mouth sores -redness, blistering, peeling or loosening of the skin, including inside the mouth -right upper belly pain -swelling of the legs or ankles -trouble passing urine or change in the amount of urine -vomiting -yellowing of the eyes or skin Side effects that usually do not require medical attention (report to your doctor or health care professional if they continue or are bothersome): -decreased  appetite -diarrhea -difficulty sleeping -headache -heartburn -joint pain -muscle cramps or pain -nausea -upset stomach This list may not describe all possible side effects. Call your doctor for medical advice about side effects. You may report side effects to FDA at 1-800-FDA-1088. Where should I keep my medicine? Keep out of reach of children. Store tablets at room temperature between 15 and 30 degrees C (59 and 86 degrees F). Protect from moisture. Keep tightly closed. Throw away any unused medicine after the expiration date. NOTE: This sheet is a summary. It may not cover all possible information. If you have questions about this medicine, talk to your doctor, pharmacist, or health care provider.    2016, [employer]. (2014-12-05 15:36:35)

## 2015-10-16 NOTE — Progress Notes (Signed)
Patient states she has diarrhea on a daily basis.  States her PCP told her not to take but 1 imodium a day.

## 2015-10-16 NOTE — Progress Notes (Signed)
Patient taking gleevac 100 mg 1 tablet three times a day.  Due to dysphagia, nausea and vomiting and diarrhea, she is unable to c/o of take this medication with food.  Teaching reinforced for diarrhea symptoms management, eating food when taking gleevac and antiemetics.  Pt instructed to take up to a max dose of 8 tablets of imodium per day if needed.  She was instructed to take her antiemetics every 6-8 hrs for nausea/vomiting prevention with each dosage of gleevac.

## 2015-10-17 ENCOUNTER — Telehealth: Payer: Self-pay | Admitting: *Deleted

## 2015-10-17 ENCOUNTER — Other Ambulatory Visit: Payer: Self-pay | Admitting: *Deleted

## 2015-10-17 DIAGNOSIS — E079 Disorder of thyroid, unspecified: Secondary | ICD-10-CM

## 2015-10-17 LAB — ERYTHROPOIETIN: Erythropoietin: 106.5 m[IU]/mL — ABNORMAL HIGH (ref 2.6–18.5)

## 2015-10-17 NOTE — Telephone Encounter (Signed)
Please contact-daughter Martie Round for relaying results. Ask them if can refer patient to a dietician in [place].

## 2015-10-17 NOTE — Telephone Encounter (Signed)
Marie [name], contacted daughter. Results of DG esoph. Explained to patient's daughter.  Dietary referral will be initiated. No preference on location in [place].     1500 on 10/17/15--[name], RN received phone call from [place] in [facility] speciality scheduling dept.  U/s guided thyroid biopsy and u/s thryoid will be scheduled for Monday July 17'th. Pt will need to arrive at 130 pm for the baseline u/s thyroid and then bx will f/u.  Rn sent msg to [facility] in cancer center scheduling, so that daughter could be contacted back with this information.

## 2015-10-17 NOTE — Telephone Encounter (Signed)
Expand All Collapse All   ----- Message from Cammie Sickle, MD sent at 10/16/2015 5:37 PM EDT ----- Please inform patient/family- that the thyroid mass pushing on the esophagus causing her to have her difficulty swallowing; otherwise no blockages in the esophagus. Recommend soft diet. Await thyroid evaluation. Can she see nutrition locally in [place] ? Thx

## 2015-10-17 NOTE — Telephone Encounter (Signed)
-----   Message from Cammie Sickle, MD sent at 10/16/2015  5:37 PM EDT ----- Please inform patient/family- that the thyroid mass pushing on the esophagus causing her to have her difficulty swallowing; otherwise no blockages in the esophagus. Recommend soft diet. Await thyroid evaluation. Can she see nutrition locally in [place] ? Thx

## 2015-10-18 NOTE — Telephone Encounter (Signed)
Entered in error

## 2015-10-22 ENCOUNTER — Ambulatory Visit
Admission: RE | Admit: 2015-10-22 | Discharge: 2015-10-22 | Disposition: A | Discharge status: Home / Self Care | Payer: Medicare HMO | Source: Ambulatory Visit | Attending: Internal Medicine | Admitting: Internal Medicine

## 2015-10-22 DIAGNOSIS — E079 Disorder of thyroid, unspecified: Secondary | ICD-10-CM | POA: Diagnosis present

## 2015-10-22 DIAGNOSIS — C73 Malignant neoplasm of thyroid gland: Secondary | ICD-10-CM | POA: Insufficient documentation

## 2015-10-22 NOTE — Sedation Documentation (Signed)
Tolerated procedure well.

## 2015-10-22 NOTE — Sedation Documentation (Signed)
No moderate sedation will be used during procedure

## 2015-10-30 ENCOUNTER — Inpatient Hospital Stay: Payer: Medicare HMO

## 2015-10-30 ENCOUNTER — Inpatient Hospital Stay (HOSPITAL_BASED_OUTPATIENT_CLINIC_OR_DEPARTMENT_OTHER): Payer: Medicare HMO | Admitting: Internal Medicine

## 2015-10-30 ENCOUNTER — Other Ambulatory Visit: Payer: Self-pay

## 2015-10-30 VITALS — BP 114/65 | HR 91 | Temp 98.0°F | Resp 18 | Wt 139.8 lb

## 2015-10-30 DIAGNOSIS — N189 Chronic kidney disease, unspecified: Principal | ICD-10-CM

## 2015-10-30 DIAGNOSIS — C787 Secondary malignant neoplasm of liver and intrahepatic bile duct: Secondary | ICD-10-CM | POA: Diagnosis not present

## 2015-10-30 DIAGNOSIS — J3801 Paralysis of vocal cords and larynx, unilateral: Secondary | ICD-10-CM

## 2015-10-30 DIAGNOSIS — C49A2 Gastrointestinal stromal tumor of stomach: Secondary | ICD-10-CM

## 2015-10-30 DIAGNOSIS — Z87891 Personal history of nicotine dependence: Secondary | ICD-10-CM

## 2015-10-30 DIAGNOSIS — Z79899 Other long term (current) drug therapy: Secondary | ICD-10-CM

## 2015-10-30 DIAGNOSIS — R221 Localized swelling, mass and lump, neck: Secondary | ICD-10-CM

## 2015-10-30 DIAGNOSIS — C772 Secondary and unspecified malignant neoplasm of intra-abdominal lymph nodes: Secondary | ICD-10-CM

## 2015-10-30 DIAGNOSIS — D631 Anemia in chronic kidney disease: Secondary | ICD-10-CM

## 2015-10-30 DIAGNOSIS — R1310 Dysphagia, unspecified: Secondary | ICD-10-CM

## 2015-10-30 LAB — CBC WITH DIFFERENTIAL/PLATELET
Basophils Absolute: 0.2 10*3/uL — ABNORMAL HIGH (ref 0–0.1)
Basophils Relative: 1 %
EOS ABS: 0.5 10*3/uL (ref 0–0.7)
Eosinophils Relative: 3 %
HCT: 27.8 % — ABNORMAL LOW (ref 35.0–47.0)
Hemoglobin: 8.9 g/dL — ABNORMAL LOW (ref 12.0–16.0)
LYMPHS ABS: 0.8 10*3/uL — AB (ref 1.0–3.6)
Lymphocytes Relative: 5 %
MCH: 33.2 pg (ref 26.0–34.0)
MCHC: 32.2 g/dL (ref 32.0–36.0)
MCV: 103.3 fL — AB (ref 80.0–100.0)
MONO ABS: 1.5 10*3/uL — AB (ref 0.2–0.9)
Monocytes Relative: 10 %
NEUTROS PCT: 81 %
Neutro Abs: 12 10*3/uL — ABNORMAL HIGH (ref 1.4–6.5)
PLATELETS: 284 10*3/uL (ref 150–440)
RBC: 2.69 MIL/uL — AB (ref 3.80–5.20)
RDW: 22.6 % — AB (ref 11.5–14.5)
WBC: 15 10*3/uL — AB (ref 3.6–11.0)

## 2015-10-30 LAB — BASIC METABOLIC PANEL
ANION GAP: 10 (ref 5–15)
BUN: 20 mg/dL (ref 6–20)
CALCIUM: 8.8 mg/dL — AB (ref 8.9–10.3)
CO2: 25 mmol/L (ref 22–32)
Chloride: 98 mmol/L — ABNORMAL LOW (ref 101–111)
Creatinine, Ser: 1.9 mg/dL — ABNORMAL HIGH (ref 0.44–1.00)
GFR calc Af Amer: 27 mL/min — ABNORMAL LOW (ref >60)
GFR, EST NON AFRICAN AMERICAN: 23 mL/min — AB (ref >60)
GLUCOSE: 100 mg/dL — AB (ref 65–99)
POTASSIUM: 4.4 mmol/L (ref 3.5–5.1)
SODIUM: 133 mmol/L — AB (ref 135–145)

## 2015-10-30 LAB — SAMPLE TO BLOOD BANK

## 2015-10-30 MED ORDER — DARBEPOETIN ALFA 200 MCG/0.4ML IJ SOSY
200.0000 ug | PREFILLED_SYRINGE | Freq: Once | INTRAMUSCULAR | Status: DC
  Administered 2015-10-30: 200 ug via SUBCUTANEOUS
  Filled 2015-10-30: qty 0.4

## 2015-10-30 MED ORDER — DARBEPOETIN ALFA 200 MCG/0.4ML IJ SOSY
200.0000 ug | PREFILLED_SYRINGE | Freq: Once | INTRAMUSCULAR | Status: DC
  Filled 2015-10-30: qty 0.4

## 2015-10-30 MED ORDER — DARBEPOETIN ALFA 200 MCG/0.4ML IJ SOSY
200.0000 ug | PREFILLED_SYRINGE | Freq: Once | INTRAMUSCULAR | Status: AC
  Administered 2015-10-30: 200 ug via SUBCUTANEOUS
  Filled 2015-10-30: qty 0.4

## 2015-10-30 NOTE — Progress Notes (Signed)
Patient asking for sleeping pill.  States she does not sleep well at night. Appetite decreased.  Supplements diet with Boost Plus.  Patient SOB all the time.  O2 95% today.

## 2015-10-30 NOTE — Assessment & Plan Note (Addendum)
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 Clinically and concerned about progression- recommend a PET scan for further evaluation. Discussed subsequent options include sunitinib- concerned about tolerance.  # Right neck mass- status post FNA concerning for malignancy awaiting final pathology from [facility]. This is quite symptomatic- difficulty swallowing/difficulty breathing [right vocal cord paralysis]. I discussed with Dr. Ladene Artist at length.   # I had a long discussion the patient and her 2 daughters regarding the difficult situation- in general to help with her breathing given the neck mass/ and difficulty swallowing- tracheostomy and PEG tube would be recommended. However patient has widespread metastatic gist/progressed on Gleevec/with poor tolerance to higher dose of Gleevec at 300 mg.  #  During the long discussion patient/family- expressed wishes regarding quality of life; and as such she started having good quality at this time. I recommend not make any decisions at this time-but await above imaging/ pathology.  # Symptomatic Anemia- multifactorial/CKD/malignancy/ Gleevet. Today hemoglobin 8.2.  patient on Aranesp every 2 weeks. Iron stores are adequate- ferritin 500.   #  patient follow-up with me in approximately 2 weeks or sooner/ based on above workup.    # 40 minutes face-to-face with the patient discussing the above plan of care; more than 50% of time spent on prognosis/ natural history; counseling and coordination.

## 2015-10-30 NOTE — Progress Notes (Signed)
Marie [name]  Patient Care Team: Marie Sickle, MD as PCP - General (Internal Medicine)  CHIEF COMPLAINTS/PURPOSE OF CONSULTATION:  Oncology History   # SEP 2013-s/p Bx- GIST Upper GIB /[facility] EGD [annular mass in stomach]; eval at [facility] [Dr.Hathorn]   # OCT 2013- Gleevec 400mg /d; 200mg / [sec to intol] March-Oct 2014- CT- PR;   # MAY 11th 2017- CT [non-contras tC/A/P compared to jan 2017]- PROGRESSION multiple innumerable lesions in liver [30mm- 3.3cm]; LUQ mass [10x 9.3--7.5 x 6.0]; new portahepatis LN; peri-tumoral LN; LN in mesenteric fat ~2.5cm   # May 11th 2017- Thyroid enlargement [R>L]  # Anemia/ CKD- on procrit     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  female patient with above history of metastatic gist tumor from [place]- Currently on [place] is here for follow-up.  Patient overall feels poorly. She is losing weight and continues to have poor appetite. She continues to choke while eating and drinking. She has difficulty sleeping at night/question shortness of breath. As per the family she has been sleeping a lot  Patient complains of progressive shortness of breath in the last few days. No cough. Patient has not been very active at home.     ROS: A complete 10 point review of system is done which is negative except mentioned above in history of present illness  MEDICAL HISTORY:  Past Medical History:  Diagnosis Date  . Anemia in CKD (chronic kidney disease)   . Anemia of chronic renal failure   . Cataract   . Stomach cancer ([place])   . Thyroid disease     SURGICAL HISTORY: Past Surgical History:  Procedure Laterality Date  . CATARACT EXTRACTION  2014    SOCIAL HISTORY:No current smoking. No alcohol. Patient lives with her daughter in  [place]. Social History   Social History  . Marital status: Widowed    Spouse name: N/A  . Number of children: N/A  . Years of education: N/A   Occupational History  . Not on file.   Social History Main Topics  . Smoking status: Former Smoker    Packs/day: 1.00    Years: 40.00    Types: Cigarettes  . Smokeless tobacco: Former Systems developer    Quit date: 09/05/2005  . Alcohol use No  . Drug use: No  . Sexual activity: Not on file   Other Topics Concern  . Not on file   Social History Narrative  . No narrative on file    FAMILY HISTORY: No family history on file.  ALLERGIES:  is allergic to penicillins.  MEDICATIONS:  Current Outpatient Prescriptions  Medication Sig Dispense Refill  . ACCU-CHEK FASTCLIX LANCETS MISC     . acetaminophen (TYLENOL) 325 MG tablet Take 650 mg by mouth.    . citalopram (CELEXA) 20 MG tablet Take 20 mg by mouth daily.     . ferrous sulfate 325 (65 FE) MG EC tablet Take 325 mg by mouth 3 (three) times daily with meals.    . imatinib (GLEEVEC) 100 MG tablet Take 4 tablets (400 mg total) by mouth daily. 120 tablet 4  . lisinopril (PRINIVIL,ZESTRIL) 2.5 MG tablet Take 2.5 mg by mouth daily.     Marland Kitchen losartan (COZAAR) 25 MG tablet Take 25 mg by mouth daily.     Marland Kitchen  ondansetron (ZOFRAN) 8 MG tablet Take 1 tablet (8 mg total) by mouth every 8 (eight) hours as needed for nausea. 60 tablet 3  . pioglitazone (ACTOS) 30 MG tablet Take 30 mg by mouth.    . ranitidine (ZANTAC) 150 MG tablet Take 150 mg by mouth.     No current facility-administered medications for this visit.    Facility-Administered Medications Ordered in Other Visits  Medication Dose Route Frequency Provider Last Rate Last Dose  . Darbepoetin Alfa (ARANESP) injection 200 mcg  200 mcg Subcutaneous Once Marie Sickle, MD          .  PHYSICAL EXAMINATION: ECOG PERFORMANCE STATUS: 2 - Symptomatic, <50% confined to bed  Vitals:   10/30/15 1205  BP: 114/65  Pulse: 91  Resp: 18  Temp:  98 F (36.7 C)   Filed Weights   10/30/15 1205  Weight: 139 lb 12.4 oz (63.4 kg)    GENERAL: Well-nourished well-developed; Alert, Mild shortness of breath on exertion. Accompanied by 2 daughters. She is in a wheel chair.  EYES: positive for pallor.  OROPHARYNX: no thrush or ulceration.  NECK: Right sided neck mass noted.  LYMPH:  no palpable lymphadenopathy in the cervical, axillary or inguinal regions LUNGS: clear to auscultation and  No wheeze or crackles HEART/CVS: regular rate & rhythm and no murmurs; No lower extremity edema ABDOMEN: abdomen soft, non-tender and normal bowel sounds Musculoskeletal:no cyanosis of digits and no clubbing  PSYCH: alert & oriented x 3 with fluent speech NEURO: no focal motor/sensory deficits SKIN:  no rashes or significant lesions  LABORATORY DATA:  I have reviewed the data as listed Lab Results  Component Value Date   WBC 15.0 (H) 10/30/2015   HGB 8.9 (L) 10/30/2015   HCT 27.8 (L) 10/30/2015   MCV 103.3 (H) 10/30/2015   PLT 284 10/30/2015    Recent Labs  10/02/15 1536 10/16/15 1455 10/30/15 1149  NA 134* 134* 133*  K 4.3 4.2 4.4  CL 102 101 98*  CO2 23 20* 25  GLUCOSE 110* 99 100*  BUN 20 25* 20  CREATININE 1.97* 1.76* 1.90*  CALCIUM 9.0 9.0 8.8*  GFRNONAA 22* 26* 23*  GFRAA 26* 30* 27*  PROT 6.4* 6.3*  --   ALBUMIN 3.2* 3.0*  --   AST 39 45*  --   ALT 12* 15  --   ALKPHOS 76 97  --   BILITOT 0.5 1.3*  --     RADIOGRAPHIC STUDIES: I have personally reviewed the radiological images as listed and agreed with the findings in the report. Ct Soft Tissue Neck Wo Contrast  Result Date: 10/10/2015 CLINICAL DATA:  Gastrointestinal stromal tumor stomach. Anemia. Neck mass EXAM: CT NECK WITHOUT CONTRAST TECHNIQUE: Multidetector CT imaging of the neck was performed following the standard protocol without intravenous contrast. COMPARISON:  None. FINDINGS: Intravenous contrast not given due to renal insufficiency. This limits evaluation  of the thyroid mass and lymph nodes. Pharynx and larynx: Tongue and pharynx normal. No mass in the tonsillar tongue. Normal vocal cords Salivary glands: Parotid and submandibular glands normal bilaterally. Thyroid: Large mass in the right thyroid. This appears lobular and may be several adjacent nodules. Overall the mass measures approximately 3.6 x 4.3 cm. Limited evaluation without intravenous contrast. No mass in the left lobe of the thyroid Lymph nodes: Right submandibular node 9 mm with ill-defined margins. Possible pathologic node. No other enlarged or suspicious nodes in the neck. Vascular: Vascular patency not evaluated without intravenous contrast.  Mild atherosclerotic calcification in the left carotid artery. Limited intracranial: Negative Visualized orbits: Not imaged Mastoids and visualized paranasal sinuses: Negative Skeleton: Multilevel disc degeneration and spondylosis which is moderate to advanced. Multilevel facet degeneration on the right. No fracture or mass lesion. Upper chest: Apical emphysema and blebs. Small left effusion. No mass or lung nodule in the apex apices. IMPRESSION: Suboptimal evaluation the thyroid and lymph nodes due to lack of intravenous contrast. The patient has renal insufficiency Lobular mass in the right lobe of the thyroid measuring approximately 3.6 x 4.3 cm. This is suspicious for neoplasm and biopsy is recommended. Right submandibular lymph node 9 mm is concerning for metastatic disease. Apical emphysema Cervical spondylosis Electronically Signed   By: Franchot Gallo M.D.   On: 10/10/2015 14:26   Dg Esophagus  Result Date: 10/16/2015 CLINICAL DATA:  Dysphagia. EXAM: ESOPHOGRAM / BARIUM SWALLOW / BARIUM TABLET STUDY TECHNIQUE: Combined double contrast and single contrast examination performed using effervescent crystals, thick barium liquid, and thin barium liquid. The patient was observed with fluoroscopy swallowing a 13 mm barium sulphate tablet. FLUOROSCOPY TIME:   Fluoroscopy Time:  1 minutes 24 seconds. Number of Acquired Images:  20. COMPARISON:  CT scan of October 10, 2015. FINDINGS: There may be some external compression of the cervical esophagus secondary to thyroid mass identified on prior CT scan. No intrinsic mass or stricture is seen involving the esophagus. No hiatal hernia or reflux is noted. Tertiary contractions are seen involving the distal esophagus suggesting presbyesophagus. Barium tablet passed without difficulty or delay through esophagus into stomach. IMPRESSION: Possible sternal compression of the cervical esophagus secondary to thyroid mass described on prior CT scan. Tertiary contractions are noted in the distal esophagus suggesting presbyesophagus. Barium tablet passed without difficulty or delay through esophagus into stomach. Electronically Signed   By: Marijo Conception, M.D.   On: 10/16/2015 16:54   US Thyroid Biopsy  Result Date: 10/22/2015 INDICATION: Right thyroid mass EXAM: ULTRASOUND GUIDED NEEDLE ASPIRATE BIOPSY OF THE THYROID GLAND MEDICATIONS: None. ANESTHESIA/SEDATION: None FLUOROSCOPY TIME:  None COMPLICATIONS: None immediate. PROCEDURE: Thyroid biopsy was thoroughly discussed with the patient and questions were answered. The benefits, risks, alternatives, and complications were also discussed. The patient understands and wishes to proceed with the procedure. Written consent was obtained. Ultrasound was performed to localize and mark an adequate site for the biopsy. The patient was then prepped and draped in a normal sterile fashion. Local anesthesia was provided with 1% lidocaine. Using direct ultrasound guidance, 6 passes were made using needles into the nodule within the right lobe of the thyroid. Ultrasound was used to confirm needle placements on all occasions. Specimens were sent to Pathology for analysis. IMPRESSION: Ultrasound guided needle aspirate biopsy performed of the right thyroid nodule. Electronically Signed   By: Marybelle Killings M.D.   On: 10/22/2015 16:13   US Thyroid  Result Date: 10/22/2015 CLINICAL DATA:  82 year old female with a right thyroid mass seen on prior CT scan of the neck EXAM: THYROID ULTRASOUND TECHNIQUE: Ultrasound examination of the thyroid gland and adjacent soft tissues was performed. COMPARISON:  CT scan of the neck 10/10/2015 FINDINGS: Right thyroid lobe Measurements: 5.8 x 4.3 x 3.1 cm. Diffusely heterogeneous right-sided thyroid gland. A heterogeneous slightly hypoechoic mass occupying the majority of the upper and mid gland measures approximately 4.2 x 2.3 x 3.4 cm. No internal dystrophic calcifications or echogenic foci. A more hypoechoic nodular component posterior to and abutting the larger mass measures 1.1 by 0.9 by  1.2 cm. A third hypoechoic nodular component posterior and inferior to the largest mass measures 1.7 x 1.0 x 1.5 cm. Again, on some images these nodules appear contiguous and may represent exophytic lobulations of the largest lesion. Left thyroid lobe Measurements: 3.7 x 2.1 x 1.3 cm. Multiple small hypoechoic nodules scattered throughout the left gland the largest of which measures 0.8 cm. Isthmus Thickness: 1.4 cm.  No nodules visualized. Lymphadenopathy Enlarged right-sided lymph nodes measuring up to 1.4 cm in short axis. IMPRESSION: 1. Heterogeneous hypoechoic solid mass with a lobular contour in the right mid and upper gland measures up to 4.2 cm. Findings meet consensus criteria for biopsy. Ultrasound-guided fine needle aspiration should be considered, as per the consensus statement: Management of Thyroid Nodules Detected at [place]: Society of Radiologists in [place]. Radiology 2005; [id number]. 2. Enlarged right-sided jugular chain lymph nodes concerning for possible metastatic disease. Recommend ultrasound-guided core biopsy of the lymph nodes which could be performed at the same time as thyroid FNA. 3. Additional right-sided thyroid nodules  posterior and inferior to the larger lesion described above are contiguous with the larger lesion and may in fact represent an exophytic component of the same process. Electronically Signed   By: Jacqulynn Cadet M.D.   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 Clinically and concerned about progression- recommend a PET scan for further evaluation. Discussed subsequent options include sunitinib- concerned about tolerance.  # Right neck mass- status post FNA concerning for malignancy awaiting final pathology from [facility]. This is quite symptomatic- difficulty swallowing/difficulty breathing [right vocal cord paralysis]. I discussed with Dr. Ladene Artist at length.   # I had a long discussion the patient and her 2 daughters regarding the difficult situation- in general to help with her breathing given the neck mass/ and difficulty swallowing- tracheostomy and PEG tube would be recommended. However patient has widespread metastatic gist/progressed on Gleevec/with poor tolerance to higher dose of Gleevec at 300 mg.  #  During the long discussion patient/family- expressed wishes regarding quality of life; and as such she started having good quality at this time. I recommend not make any decisions at this time-but await above imaging/ pathology.  # Symptomatic Anemia- multifactorial/CKD/malignancy/ Gleevet. Today hemoglobin 8.2.  patient on Aranesp every 2 weeks. Iron stores are adequate- ferritin 500.   #  patient follow-up with me in approximately 2 weeks or sooner/ based on above workup.    # 40 minutes face-to-face with the patient discussing the above plan of care; more than 50% of time spent on prognosis/ natural history; counseling and coordination.  All questions were answered. The patient  knows to call the clinic with any problems, questions or concerns.     Marie Sickle, MD 10/30/2015 1:02 PM

## 2015-11-01 ENCOUNTER — Ambulatory Visit
Admission: RE | Admit: 2015-11-01 | Discharge: 2015-11-01 | Disposition: A | Discharge status: Home / Self Care | Payer: Medicare HMO | Source: Ambulatory Visit | Attending: Internal Medicine | Admitting: Internal Medicine

## 2015-11-01 DIAGNOSIS — C786 Secondary malignant neoplasm of retroperitoneum and peritoneum: Secondary | ICD-10-CM | POA: Diagnosis not present

## 2015-11-01 DIAGNOSIS — C787 Secondary malignant neoplasm of liver and intrahepatic bile duct: Secondary | ICD-10-CM | POA: Insufficient documentation

## 2015-11-01 DIAGNOSIS — R188 Other ascites: Secondary | ICD-10-CM | POA: Diagnosis not present

## 2015-11-01 DIAGNOSIS — C49A2 Gastrointestinal stromal tumor of stomach: Secondary | ICD-10-CM | POA: Insufficient documentation

## 2015-11-01 DIAGNOSIS — R590 Localized enlarged lymph nodes: Secondary | ICD-10-CM | POA: Diagnosis not present

## 2015-11-01 DIAGNOSIS — J90 Pleural effusion, not elsewhere classified: Secondary | ICD-10-CM | POA: Diagnosis not present

## 2015-11-01 LAB — GLUCOSE, CAPILLARY: GLUCOSE-CAPILLARY: 94 mg/dL (ref 65–99)

## 2015-11-01 MED ORDER — FLUDEOXYGLUCOSE F - 18 (FDG) INJECTION
11.9500 | Freq: Once | INTRAVENOUS | Status: AC | PRN
  Administered 2015-11-01: 11.95 via INTRAVENOUS

## 2015-11-02 ENCOUNTER — Telehealth: Payer: Self-pay | Admitting: Internal Medicine

## 2015-11-02 NOTE — Telephone Encounter (Signed)
Please call them when you have test results back. Thanks.

## 2015-11-05 NOTE — Telephone Encounter (Signed)
Reviewed chart-pathology has not yet finalized.

## 2015-11-06 ENCOUNTER — Other Ambulatory Visit: Payer: Self-pay | Admitting: Internal Medicine

## 2015-11-06 NOTE — Telephone Encounter (Signed)
Discussed hospice with the patient's daughters; we will initiate referral in the morning. Dr.B

## 2015-11-07 ENCOUNTER — Telehealth: Payer: Self-pay | Admitting: *Deleted

## 2015-11-07 NOTE — Telephone Encounter (Signed)
hospcie ref. [facility] and note placed in the chart re: this

## 2015-11-07 NOTE — Telephone Encounter (Signed)
-----   Message from Cammie Sickle, MD sent at 11/06/2015  9:39 PM EDT ----- Discussed with family in detail- progression; recommend hospice.poor prognosis.  Will initiate hospice in AM. Dr.B

## 2015-11-07 NOTE — Telephone Encounter (Signed)
Per conversation with daughter on 11/06/15-pt is failure to thrive. She is unable to ambulate on her own. Family must provide ADL care. Pt unable to eat due to dysphagia. Chokes while eating. She is rapidly declining. Daughter reports episodes of altered mental status and confusion. Thyroid mass in neck is causing decreased respiration and shortness of breath.  Given the patient's current status, pt and daughters have discussed with patient and agreed that hospice services are needed asap. Declines further treatment and feeding tube. Md does not recommend any feeding tube at this time.

## 2015-11-07 NOTE — Telephone Encounter (Signed)
Searched for hospice services in [place].  The only hospice located in [place] [facility] at [phone].  Called and gave information and will fax info to [phone].  Called daughter Marie [name] and she states that as long as hospice comes into the home she will be fine with it.  I told her they do come into the home.  I gave her the number to arrange a new pt appt in the home and the agency said they can come today and daughter is happy about that.  I then called back to the hospice and spoke to tracy and told her the family is agreeable.  She will fax me a face to face sheet.  I faxed last note, last imaging, the pathology report from GIST dx 2014, demographics and insurance card.  Daughter to give ss # to agency-it is not on file and daughter wasin car when I called her and she did not have it available at the time of call but would get it for the service.

## 2015-11-07 NOTE — Telephone Encounter (Signed)
Pt needs referral to hospice in [place]

## 2015-11-07 NOTE — Telephone Encounter (Signed)
-----   Message from Shawnee Knapp, RN sent at 11/06/2015  2:12 PM EDT ----- Regarding: PLAN OF CARE Patient daughter requesting a return call regarding treatment plan and plan of care for this patient.

## 2015-11-08 LAB — PULMONARY FUNCTION TEST

## 2015-11-08 LAB — CYTOLOGY - NON PAP

## 2015-11-09 ENCOUNTER — Telehealth: Payer: Self-pay | Admitting: Internal Medicine

## 2015-11-09 LAB — PULMONARY FUNCTION TEST

## 2015-11-09 NOTE — Telephone Encounter (Signed)
Please call [facility] when you get the pathology report. Thank you!

## 2015-11-12 NOTE — Telephone Encounter (Signed)
Contacted [facility]-pt's daughter back. She conference her sister in with the conversation. Discussed final pathology results. + malignancy with thyroid biopsy. However, the exact type of thyroid cancer is undetermined. MD would need more tissue to determine the exact time. RN explained that even given this additional clinical information-the place for hospice/pallative care would not change.  Daughter gave verbal understanding.  Pt continues to decline. Suspected beginning signs of mottling in legs per daughter started this weekend. Pt's hands have poor circulation and pt's palms are cold and visibly red at times. Explained to daughters that this is a sign of poor perfusion and circulation.  Pt has persistent signs of agonal breathing. Respirations go from shallow to panting to hard laborious breaths. Oxygen at home was bumped to 3Ls.  Hospice started pt on comfort kit-roxaxol and oral lorezpam. Daughter states that this was usually given once or twice a day. Daughters states that pt sleeps most of the day. They expressed "worries about given the patient too much oral roxanol and decreased respirations."  I discussed the patient's daughter's concerns in depth.  Daughters stated the family was given the 'Gone from my Sight" blue book regarding end of care life and what to expect. She states that this book as been very helpful to understand what to expect at the end of life. Reassurance and ongoing emotional support provided to family.  Questions were answered to daughter' expectations per family. She thanked me for calling her back.  Approximately 15 minutes spent talking to patient's family via this telephone call.

## 2015-11-13 ENCOUNTER — Ambulatory Visit: Payer: Medicare HMO

## 2015-11-13 ENCOUNTER — Ambulatory Visit: Payer: Medicare HMO | Admitting: Internal Medicine

## 2015-11-13 ENCOUNTER — Other Ambulatory Visit: Payer: Medicare HMO

## 2015-12-07 DEATH — deceased

## 2016-03-15 ENCOUNTER — Other Ambulatory Visit: Payer: Self-pay | Admitting: Nurse Practitioner

## 2018-06-30 IMAGING — CT NM PET TUM IMG INITIAL (PI) SKULL BASE T - THIGH
1 of 10 series · 1 of 25 positions shown · non-contrast
Comparison: None.

CLINICAL DATA: Initial treatment strategy for metastatic GI stromal
tumor.

EXAM:
NUCLEAR MEDICINE PET SKULL BASE TO THIGH
TECHNIQUE: 12.0 mCi F-18 FDG was injected intravenously. Full-ring PET imaging
was performed from the skull base to thigh after the radiotracer. CT
data was obtained and used for attenuation correction and anatomic
localization.
FASTING BLOOD GLUCOSE:  Value: 94 mg/dl

[Series 3: ct wb 5.0 b30f · axial · 5.0mm · 0.98mm/px · 1 of 287 slices shown]
[im 287/287  brain]
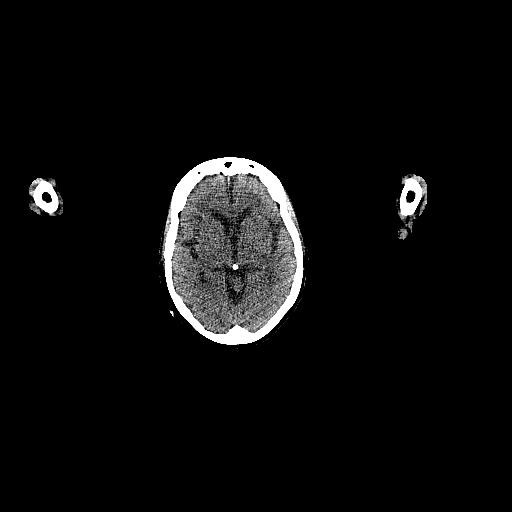

[1 of 25 positions shown; findings below may reference images not displayed]

FINDINGS: NECK

Large hypermetabolic mass is seen involving the right thyroid lobe
and isthmus which measures 4.2 x 5.3 cm on image 52/3. This is
hypermetabolic with SUV max of 11.1.

Hypermetabolic lymphadenopathy seen in the right supraclavicular
region, with index lymph node measuring 1.5 cm on image 51/3 with
SUV max of 9.3. Small hypermetabolic right jugular level 2 and 3
lymph nodes are also noted.

CHEST

[DATE] x 2.0 cm hypermetabolic soft tissue density seen in the left
cardiophrenic angle on image 98/3 which has SUV max of 9.6. No other
hypermetabolic lymph nodes or masses identified within the thorax.

A small left pleural effusion is seen without associated metabolic
activity. Moderate emphysema noted. A 6 mm pulmonary nodule is seen
in the right middle lobe on image 98/3. This shows no associated
metabolic activity, but is too small to characterize by PET. No
other suspicious pulmonary nodules seen on CT images.

ABDOMEN/PELVIS

Large soft tissue mass in the left upper quadrant is seen which
involves the stomach, and is also contiguous with the spleen and
pancreas. This measures 10.7 x 12.5 cm on image 131/3 and has an SUV
max of 15.4. This presumably represents the primary GI stromal
tumor.

Diffuse hypermetabolic liver metastases are seen throughout the
right and left hepatic lobes. Representative SUV max obtained from
metastases in the lateral segment left lobe measures 14.7.

Numerous hypermetabolic intraperitoneal soft tissue masses are seen
throughout the abdominal and pelvic omentum and mesenteric,
consistent with peritoneal metastatic disease. Index conglomeration
of soft tissue masses in the left abdominal omentum measures 11.9 x
4.3 cm on image 159/3, with SUV max of 16.7.

No evidence of bowel obstruction.  Mild pelvic ascites noted.

SKELETON

No focal hypermetabolic activity to suggest skeletal metastasis.
IMPRESSION: Large hypermetabolic left upper quadrant soft tissue mass involving
the stomach, spleen, and pancreas. This is consistent with primary
malignant GI stromal stromal tumor.

Bulky peritoneal metastatic disease throughout the abdomen and
pelvis. Mild pelvic ascites.

Diffuse liver metastases.

Mild hypermetabolic lymphadenopathy or mass in the left
cardiophrenic angle.

Indeterminate 6 mm right middle lobe pulmonary nodule which is too
small to characterize by PET. Small left pleural effusion.

5 cm hypermetabolic mass involving the right thyroid gland, with
hypermetabolic right supraclavicular and jugular lymphadenopathy.
Differential diagnosis includes metastatic disease from GIST, or
primary thyroid carcinoma with right cervical lymph node metastases.
